# Patient Record
Sex: Female | Born: 1990
Health system: Southern US, Community
[De-identification: ages and names within clinical notes are randomized; demographics above are authoritative.]

## PROBLEM LIST (undated history)

## (undated) DIAGNOSIS — D573 Sickle-cell trait: Secondary | ICD-10-CM

## (undated) DIAGNOSIS — K589 Irritable bowel syndrome without diarrhea: Secondary | ICD-10-CM

## (undated) DIAGNOSIS — D649 Anemia, unspecified: Secondary | ICD-10-CM

## (undated) HISTORY — DX: Anemia, unspecified: D64.9

---
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
## 2010-04-06 ENCOUNTER — Encounter: Payer: Self-pay | Admitting: Sports Medicine

## 2012-12-09 ENCOUNTER — Other Ambulatory Visit: Payer: Self-pay | Admitting: Internal Medicine

## 2012-12-09 DIAGNOSIS — K3189 Other diseases of stomach and duodenum: Secondary | ICD-10-CM

## 2012-12-14 ENCOUNTER — Ambulatory Visit
Admission: RE | Admit: 2012-12-14 | Discharge: 2012-12-14 | Disposition: A | Discharge status: Home / Self Care | Payer: No Typology Code available for payment source | Source: Ambulatory Visit | Attending: Internal Medicine | Admitting: Internal Medicine

## 2012-12-14 DIAGNOSIS — K3189 Other diseases of stomach and duodenum: Secondary | ICD-10-CM

## 2013-05-25 ENCOUNTER — Ambulatory Visit
Admission: RE | Admit: 2013-05-25 | Discharge: 2013-05-25 | Disposition: A | Discharge status: Home / Self Care | Payer: No Typology Code available for payment source | Source: Ambulatory Visit | Attending: Infectious Disease | Admitting: Infectious Disease

## 2013-05-25 ENCOUNTER — Other Ambulatory Visit: Payer: Self-pay | Admitting: Infectious Disease

## 2013-05-25 DIAGNOSIS — Z201 Contact with and (suspected) exposure to tuberculosis: Secondary | ICD-10-CM

## 2013-11-06 ENCOUNTER — Encounter: Payer: Self-pay | Admitting: Nurse Practitioner

## 2015-01-30 IMAGING — US US ABDOMEN COMPLETE
1 series · 14 of 25 positions shown · non-contrast
Comparison: None.

CLINICAL DATA: Dyspepsia

EXAM:
ULTRASOUND ABDOMEN COMPLETE

[Series 1: us abdomen complete · 0.28mm/px · 14 of 75 slices shown]
[im 1/75]
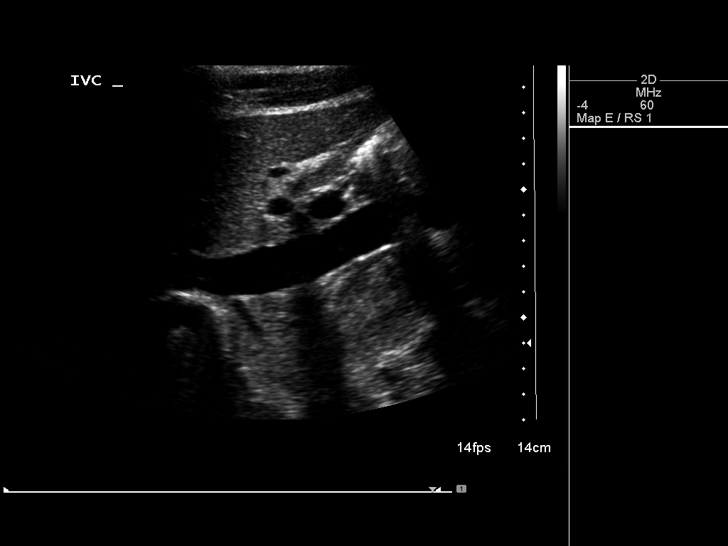
[im 7/75]
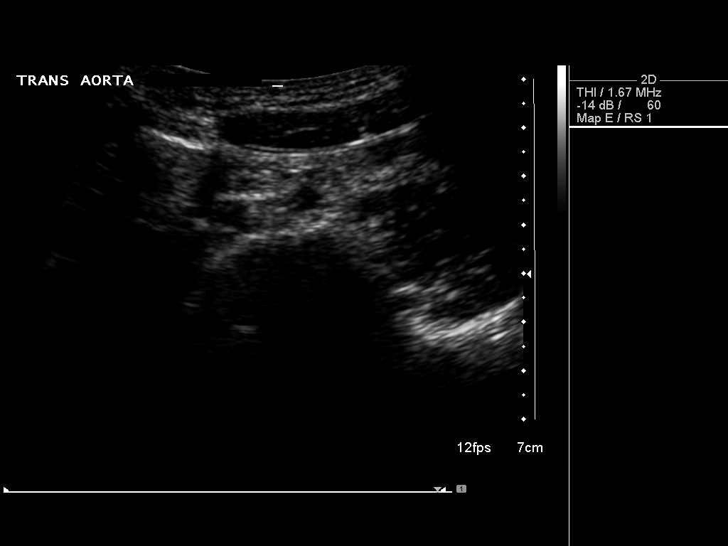
[im 13/75]
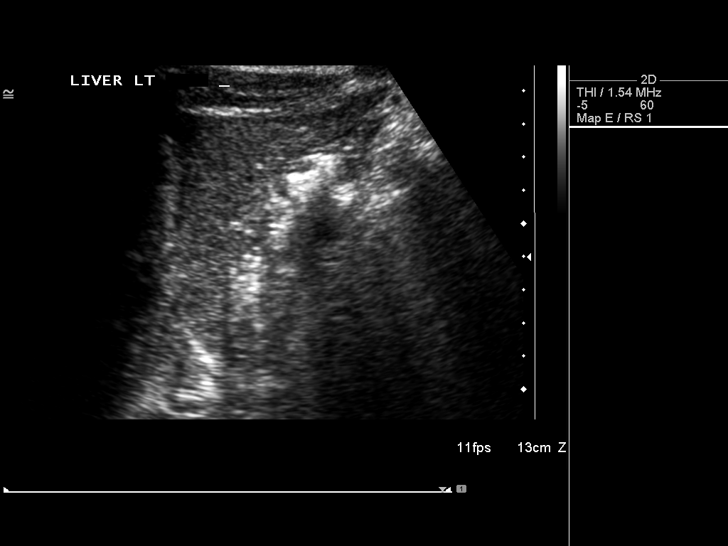
[im 19/75]
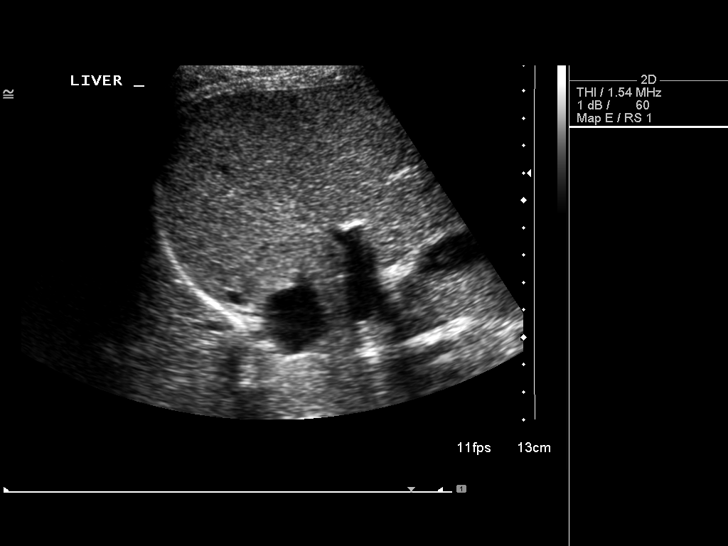
[im 25/75]
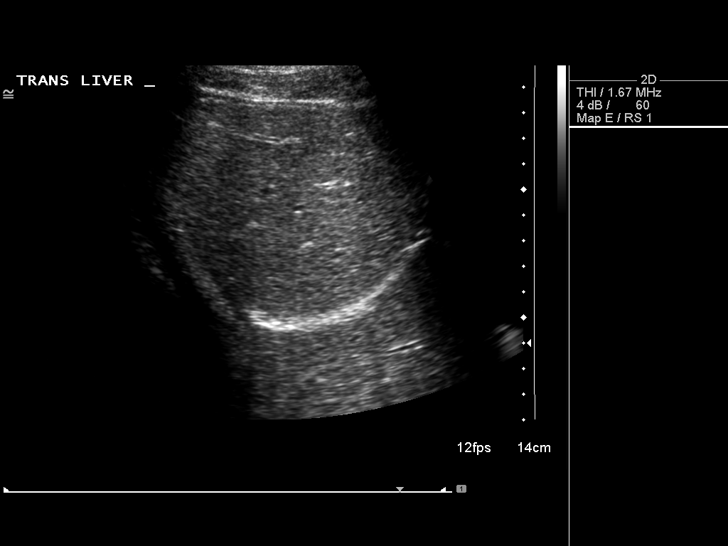
[im 28/75]
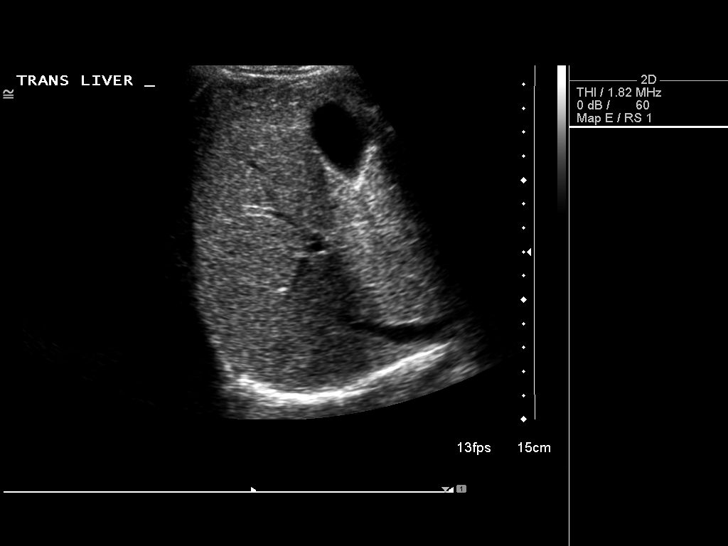
[im 34/75]
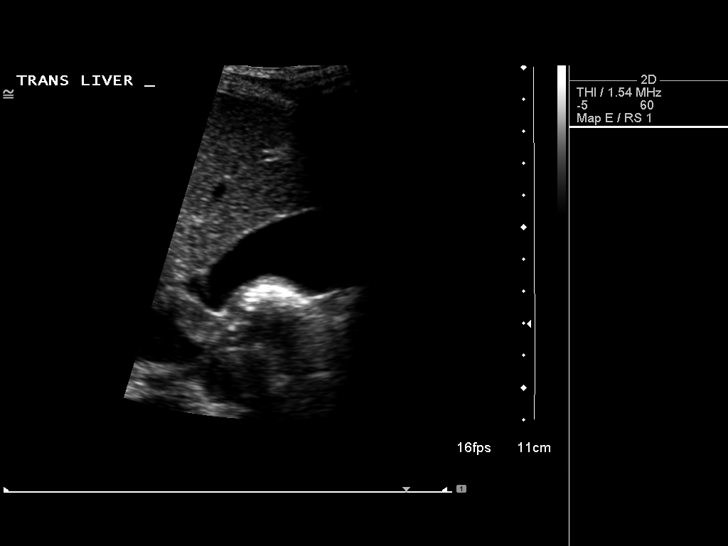
[im 41/75]
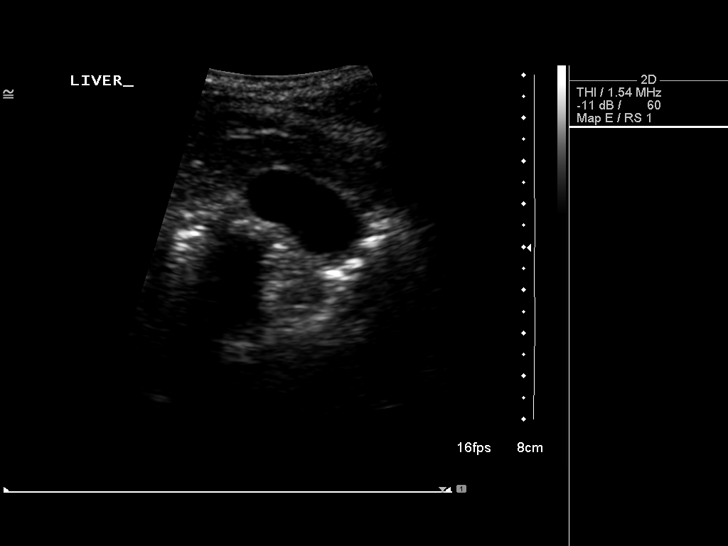
[im 47/75]
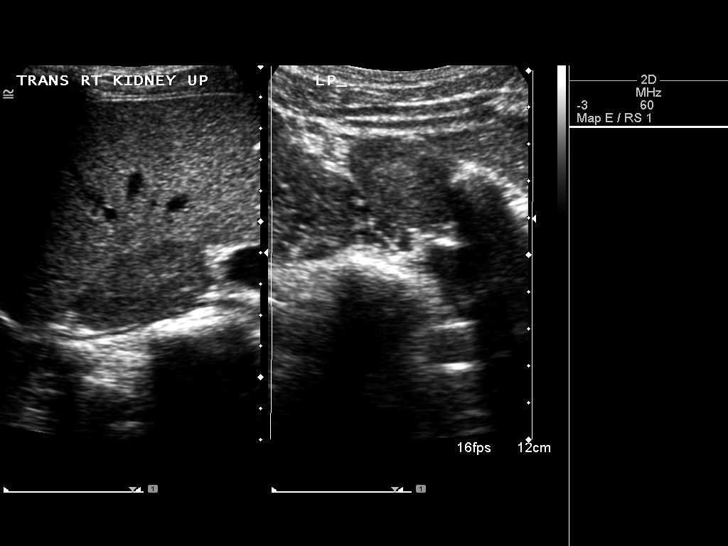
[im 50/75]
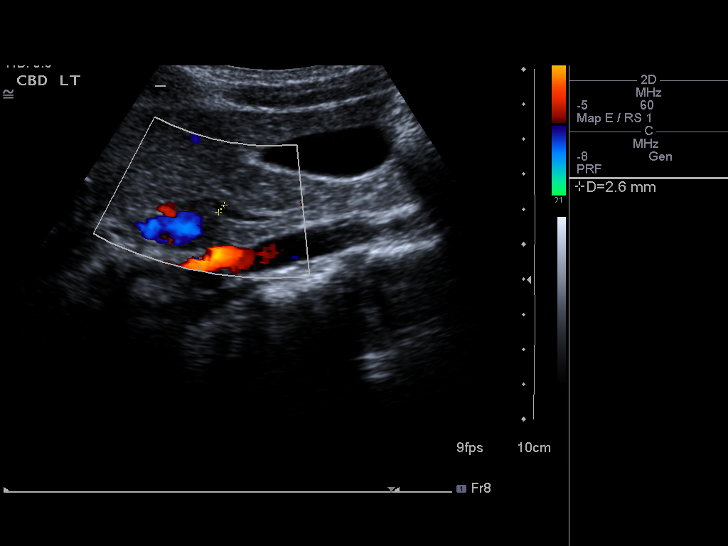
[im 56/75]
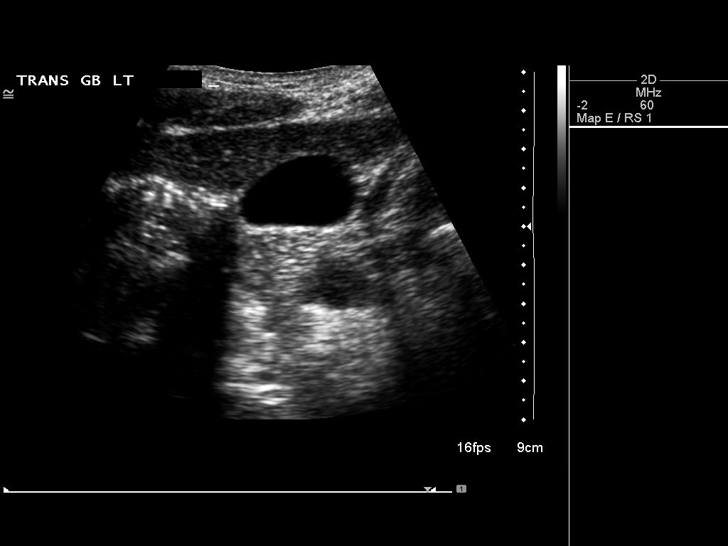
[im 62/75]
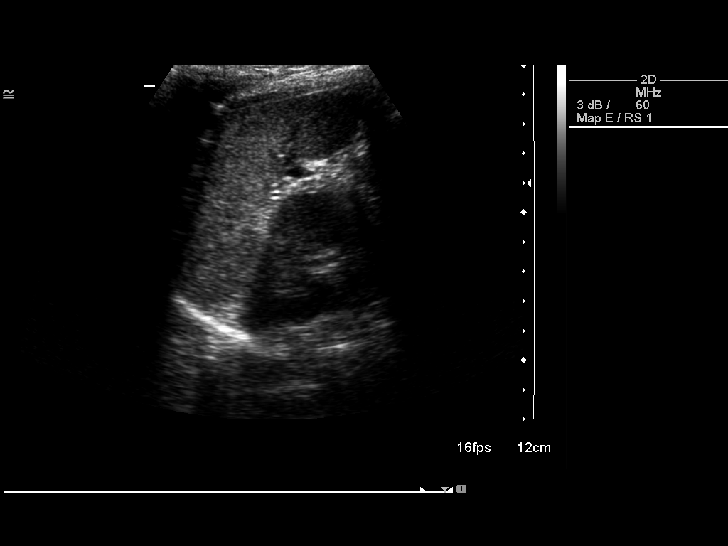
[im 68/75]
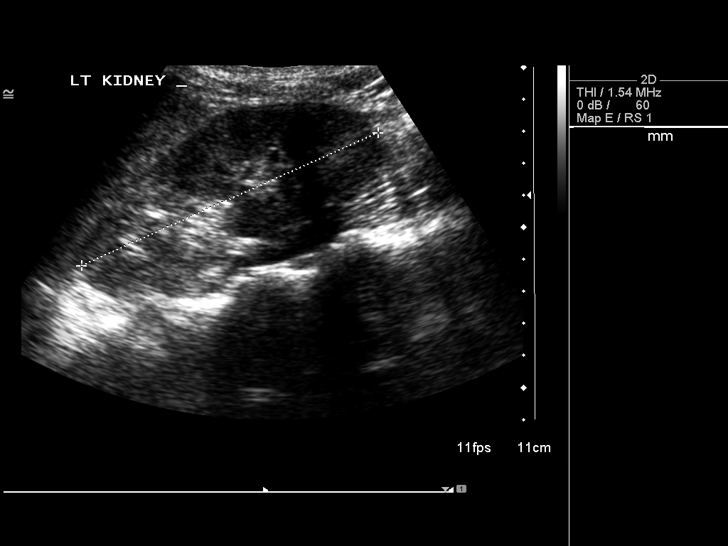
[im 75/75]
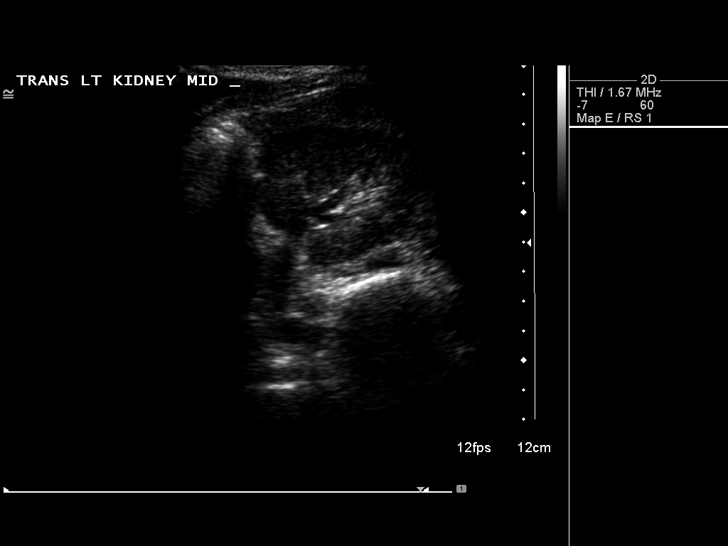

[14 of 25 positions shown; findings below may reference images not displayed]

FINDINGS: Gallbladder

No gallstones, gallbladder wall thickening, or pericholecystic
fluid. Negative sonographic Murphy's sign.

Common bile duct

Diameter: 3 mm.

Liver

No focal lesion identified. Within normal limits in parenchymal
echogenicity.

IVC

No abnormality visualized.

Pancreas

Incompletely visualized but grossly unremarkable.

Spleen

Measures 8.7 cm.

Right Kidney

Length: 10.6 cm. Echogenicity within normal limits. No mass or
hydronephrosis visualized.

Left Kidney

Length: 10.1 cm. Echogenicity within normal limits. No mass or
hydronephrosis visualized.

Abdominal aorta

No aneurysm visualized.
IMPRESSION: Negative abdominal ultrasound.

## 2015-07-11 IMAGING — CR DG CHEST 1V
1 series · 1 of 1 positions shown · non-contrast
Comparison: Chest x-ray of 12/30/2007

CLINICAL DATA: Exposure to TB

EXAM:
CHEST - 1 VIEW

[view not recorded]
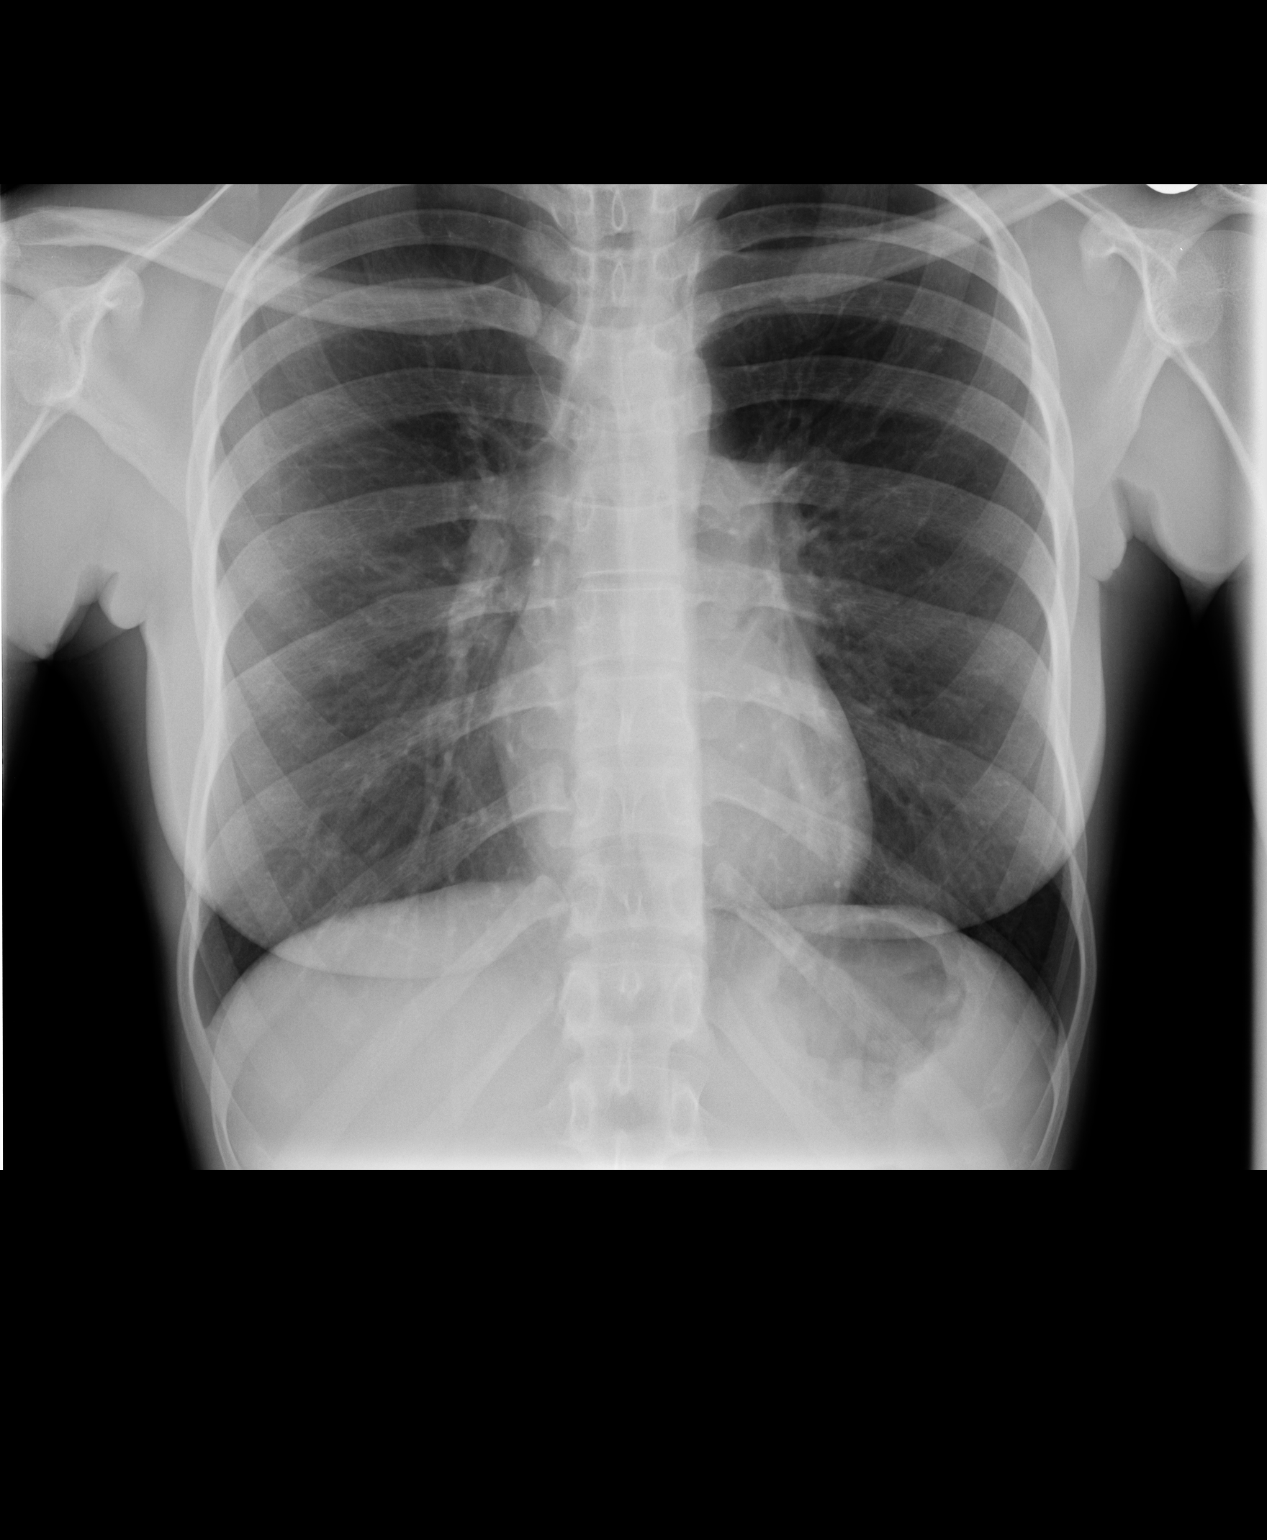

[1 of 1 positions shown; findings below may reference images not displayed]

FINDINGS: No active infiltrate or effusion is seen. No sequela of prior
tuberculous infection is seen. Mediastinal contours are stable and
heart size is normal. No bony abnormality is seen.
IMPRESSION: No active disease.

## 2016-09-20 DIAGNOSIS — M545 Low back pain: Secondary | ICD-10-CM | POA: Insufficient documentation

## 2016-09-21 ENCOUNTER — Encounter (HOSPITAL_COMMUNITY): Payer: Self-pay | Admitting: Emergency Medicine

## 2016-09-21 ENCOUNTER — Emergency Department (HOSPITAL_COMMUNITY)
Admission: EM | Admit: 2016-09-21 | Discharge: 2016-09-21 | Disposition: A | Discharge status: Home / Self Care | Payer: 59 | Attending: Emergency Medicine | Admitting: Emergency Medicine

## 2016-09-21 DIAGNOSIS — M545 Low back pain, unspecified: Secondary | ICD-10-CM

## 2016-09-21 HISTORY — DX: Sickle-cell trait: D57.3

## 2016-09-21 HISTORY — DX: Irritable bowel syndrome without diarrhea: K58.9

## 2016-09-21 MED ORDER — OXYCODONE-ACETAMINOPHEN 5-325 MG PO TABS
ORAL_TABLET | ORAL | Status: AC
  Filled 2016-09-21: qty 1

## 2016-09-21 MED ORDER — METHOCARBAMOL 500 MG PO TABS: 500.0000 mg | ORAL_TABLET | Freq: Two times a day (BID) | ORAL | 0 refills | Status: AC

## 2016-09-21 MED ORDER — IBUPROFEN 600 MG PO TABS: 600.0000 mg | ORAL_TABLET | Freq: Four times a day (QID) | ORAL | 0 refills | Status: AC | PRN

## 2016-09-21 MED ORDER — IBUPROFEN 400 MG PO TABS
600.0000 mg | ORAL_TABLET | Freq: Once | ORAL | Status: AC
  Administered 2016-09-21: 600 mg via ORAL
  Filled 2016-09-21: qty 1

## 2016-09-21 MED ORDER — OXYCODONE-ACETAMINOPHEN 5-325 MG PO TABS
1.0000 | ORAL_TABLET | Freq: Once | ORAL | Status: AC
  Administered 2016-09-21: 1 via ORAL

## 2016-09-21 MED FILL — METHOCARBAMOL 500 MG TABLET: 500 | 10 days supply | Qty: 20 | Fill #0

## 2016-09-21 MED FILL — IBUPROFEN 600 MG TABLET: 600 | 8 days supply | Qty: 30 | Fill #0

## 2016-09-21 NOTE — ED Provider Notes (Signed)
[facility]-EMERGENCY DEPT Provider Note   CSN: [id number] Arrival date & time: 09/20/16  2330     History   Chief Complaint Chief Complaint  Patient presents with  . Back Pain    HPI Nicole Hernandez is a 26 y.o. female.  Patient presents with low back pain that has been progressive x 1 week. She has tried ibuprofen, Norco, heat without relief. Pain has been constant but is worse with movement. No urinary or bowel incontinence. No numbness or weakness. The pain extends upward to upper back but no radiation to LE's. No chest or abdominal pain. History of back pain dating back to a teenage injury. No history of surgeries.       Past Medical History:  Diagnosis Date  . IBS (irritable bowel syndrome)   . Sickle cell trait (HCC)     There are no active problems to display for this patient.   History reviewed. No pertinent surgical history.  OB History    No data available       Home Medications    Prior to Admission medications   Not on File    Family History No family history on file.  Social History Social History  Substance Use Topics  . Smoking status: Never Smoker  . Smokeless tobacco: Never Used  . Alcohol use No     Allergies   Patient has no known allergies.   Review of Systems Review of Systems  Constitutional: Negative for chills and fever.  Respiratory: Negative.   Cardiovascular: Negative.   Gastrointestinal: Negative.   Genitourinary: Negative for enuresis.  Musculoskeletal:       See HPI.  Skin: Negative.   Neurological: Negative.      Physical Exam Updated Vital Signs BP 108/74 (BP Location: Right Arm)   Pulse 84   Temp 98.9 F (37.2 C) (Oral)   Resp 16   SpO2 100%   Physical Exam  Constitutional: She is oriented to person, place, and time. She appears well-developed and well-nourished.  Neck: Normal range of motion.  Pulmonary/Chest: Effort normal. No respiratory distress.  Musculoskeletal: Normal range of motion.    Reproducible tenderness to generalized back. No swelling. Pain increases with certain positions, however, she maintains FROM of back and extremities.   Neurological: She is alert and oriented to person, place, and time.  CN's 3-12 grossly intact. Speech is clear and focused. No facial asymmetry. No lateralizing weakness. No deficits of coordination. Ambulatory without imbalance.    Skin: Skin is warm and dry.     ED Treatments / Results  Labs (all labs ordered are listed, but only abnormal results are displayed) Labs Reviewed - No data to display  EKG  EKG Interpretation None       Radiology No results found.  Procedures Procedures (including critical care time)  Medications Ordered in ED Medications  oxyCODONE-acetaminophen (PERCOCET/ROXICET) 5-325 MG per tablet 1 tablet (1 tablet Oral Given 09/21/16 0013)     Initial Impression / Assessment and Plan / ED Course  I have reviewed the triage vital signs and the nursing notes.  Pertinent labs & imaging results that were available during my care of the patient were reviewed by me and considered in my medical decision making (see chart for details).     Patient with pain in generalized back, progressive over the last week. 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 No neurologic red flags.  Will treat as muscular strain/spasm. Refer to ortho is pain continues.   Final Clinical Impressions(s) / ED Diagnoses   Final diagnoses:  None   1. Muscular back pain  New Prescriptions New Prescriptions   No medications on file     Elpidio AnisUpstill, Quinlan Vollmer, Cordelia Poche-C 09/21/16 [id number]    Derwood KaplanNanavati, Ankit, MD 09/21/16 [phone]

## 2016-09-21 NOTE — ED Triage Notes (Signed)
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 , pain increases with movement .

## 2016-09-25 DIAGNOSIS — S335XXA Sprain of ligaments of lumbar spine, initial encounter: Secondary | ICD-10-CM | POA: Diagnosis not present

## 2016-09-29 DIAGNOSIS — S335XXA Sprain of ligaments of lumbar spine, initial encounter: Secondary | ICD-10-CM | POA: Diagnosis not present

## 2016-10-13 DIAGNOSIS — S335XXD Sprain of ligaments of lumbar spine, subsequent encounter: Secondary | ICD-10-CM | POA: Diagnosis not present

## 2016-10-26 DIAGNOSIS — M25512 Pain in left shoulder: Secondary | ICD-10-CM | POA: Diagnosis not present

## 2016-10-26 DIAGNOSIS — M545 Low back pain: Secondary | ICD-10-CM | POA: Diagnosis not present

## 2016-10-26 DIAGNOSIS — M6281 Muscle weakness (generalized): Secondary | ICD-10-CM | POA: Diagnosis not present

## 2016-12-05 ENCOUNTER — Ambulatory Visit: Payer: Self-pay | Admitting: Physician Assistant

## 2016-12-07 ENCOUNTER — Ambulatory Visit (INDEPENDENT_AMBULATORY_CARE_PROVIDER_SITE_OTHER): Payer: 59 | Admitting: Physician Assistant

## 2016-12-07 ENCOUNTER — Encounter: Payer: Self-pay | Admitting: Physician Assistant

## 2016-12-07 VITALS — BP 102/62 | HR 83 | Temp 98.3°F | Resp 16 | Ht 63.5 in | Wt 104.4 lb

## 2016-12-07 DIAGNOSIS — Z0000 Encounter for general adult medical examination without abnormal findings: Secondary | ICD-10-CM

## 2016-12-07 DIAGNOSIS — Z1329 Encounter for screening for other suspected endocrine disorder: Secondary | ICD-10-CM | POA: Diagnosis not present

## 2016-12-07 DIAGNOSIS — R5383 Other fatigue: Secondary | ICD-10-CM | POA: Diagnosis not present

## 2016-12-07 DIAGNOSIS — Z23 Encounter for immunization: Secondary | ICD-10-CM | POA: Diagnosis not present

## 2016-12-07 NOTE — Progress Notes (Signed)
[facility] [place], [place] [address] [phone]  Date:  12/07/2016   Name:  Nicole Hernandez   DOB:  1990-05-30   MRN:  [id number]  PCP:  Patient, No Pcp Per    Chief Complaint: Annual Exam (no pap) and sleep (still feel tired after sleeping, "restless")   History of Present Illness:  This is a 26 y.o. female with PMH anemia who is presenting for CPE. She is in the nursing program at [employer]  Complaints: fatigue x 1 year. She goes to bed around the same time every night. Does not wake up at night, no snoring.  LMP: May 2018 Contraception:  Nexplanon  Last pap: 2016  Sexual history: she has one female partner x 2.5 years Immunizations: UTD. She would like flu shot Dentist: regular check ups Eye: right 20/30, 20/40, b/l 20/30 Diet/Exercise: she does not exercise. Eats African diet: carbs, greens, rice. Eats a lot of fast food.  Fam hx: mother: pituitary tumor Tobacco/alcohol/substance use: never/no/no  Review of Systems:  Review of Systems  Constitutional: Positive for fatigue. Negative for chills, diaphoresis and fever.  HENT: Negative for congestion, postnasal drip, rhinorrhea, sinus pressure, sneezing and sore throat.   Respiratory: Negative for cough, chest tightness, shortness of breath and wheezing.   Cardiovascular: Negative for chest pain and palpitations.  Gastrointestinal: Negative for abdominal pain, diarrhea, nausea and vomiting.  Genitourinary: Negative for decreased urine volume, difficulty urinating, dysuria, enuresis, flank pain, frequency, hematuria and urgency.  Musculoskeletal: Negative for back pain.  Neurological: Negative for dizziness, weakness, light-headedness and headaches.  Psychiatric/Behavioral: Negative for sleep disturbance.    There are no active problems to display for this patient.   Prior to Admission medications   Medication Sig Start Date End Date Taking? Authorizing Provider  ibuprofen (ADVIL,MOTRIN) 600 MG tablet  Take 1 tablet (600 mg total) by mouth every 6 (six) hours as needed. 09/21/16  Yes Upstill, Nehemiah Settle, PA-C  methocarbamol (ROBAXIN) 500 MG tablet Take 1 tablet (500 mg total) by mouth 2 (two) times daily. Patient not taking: Reported on 12/07/2016 09/21/16   Charlann Lange, PA-C    No Known Allergies  No past surgical history on file.  Social History  Substance Use Topics  . Smoking status: Never Smoker  . Smokeless tobacco: Never Used  . Alcohol use No    No family history on file.  Medication list has been reviewed and updated.  Physical Examination:  Physical Exam  Constitutional: She is oriented to person, place, and time. She appears well-developed and well-nourished. No distress.  HENT:  Head: Normocephalic and atraumatic.  Mouth/Throat: Oropharynx is clear and moist.  Eyes: Pupils are equal, round, and reactive to light. Conjunctivae and EOM are normal.  Cardiovascular: Normal rate, regular rhythm and normal heart sounds.   No murmur heard. Pulmonary/Chest: Effort normal and breath sounds normal. She has no wheezes.  Musculoskeletal: Normal range of motion.  Neurological: She is alert and oriented to person, place, and time. She has normal reflexes.  Skin: Skin is warm and dry.  Psychiatric: She has a normal mood and affect. Her behavior is normal. Judgment and thought content normal.  Vitals reviewed.   BP 102/62   Pulse 83   Temp 98.3 F (36.8 C) (Oral)   Resp 16   Ht 5' 3.5" (1.613 m)   Wt 104 lb 6.4 oz (47.4 kg)   SpO2 97%   BMI 18.20 kg/m   Assessment and Plan: 1. Annual physical  exam - 26 year old female presents for annual exam c/o fatigue. Encouraged pt to start exercising, take multivitamin. RTC next year for PAP. Labs are pending, will contact with results.   2. Fatigue, unspecified type - CBC with Differential/Platelet - Vitamin B12 - Thyroid Panel With TSH  3. Screening for endocrine disorder - CMP14+EGFR  4. Need for prophylactic vaccination  and inoculation against influenza - Flu Vaccine QUAD 36+ mos IM   Mercer Pod, PA-C  [facility] [place] 12/07/2016 4:49 PM

## 2016-12-07 NOTE — Patient Instructions (Addendum)
Thank you for coming in today. I hope you feel we met your needs.  Feel free to call PCP if you have any questions or further requests.  Please consider signing up for MyChart if you do not already have it, as this is a great way to communicate with me.  Best,  [employer], PA-C  Start taking a multivitamin.  Start exercising.  We will contact you with the results of your lab work.   What are the benefits of exercise?-Exercise has many benefits. It can: ?Burn calories, which helps people control their weight ?Help control blood sugar levels in people with diabetes ?Lower blood pressure, especially in people with high blood pressure ?Lower stress and help with depression ?Keep bones strong, so they don't get thin and break easily ?Lower the chance of dying from heart disease   What are the main types of exercise?-There are 3 main types of exercise. They are: ?Aerobic exercise - Aerobic exercise raises a person's heart rate. Examples of aerobic exercise are walking, running, or swimming. ?Resistance training - Resistance training helps make your muscles stronger. People can do this type of exercise using weights, exercise bands, or weight machines. ?Stretching - Stretching exercises help your muscles and joints move more easily. It's important to have all 3 types of exercise in your exercise program. That way, your body, muscles, and joints can be as healthy as possible.  For substantial health benefits, adults are recommended to perform moderate-intensity aerobic exercise or vigorous aerobic exercise as follows: ?Moderate-intensity aerobic exercise for 150 minutes every week AND muscle-strengthening activities involving all major muscle groups at least two days per week, OR ?Vigorous-intensity aerobic exercise for 75 minutes every week AND muscle-strengthening activities involving all major muscle groups at least two days per week, OR ?An equivalent mix of moderate- and  vigorous-intensity aerobic exercise AND muscle-strengthening activities involving all major muscle groups at least two days per week  What should I do when I exercise?-Each time you exercise, you should: ?Warm up - Warming up can help keep you from hurting your muscles when you exercise. To warm up, do a light aerobic exercise (such as walking slowly) or stretch for 5 to 10 minutes. ?Work out - During a workout, you can walk fast, swim, run, or use an exercise machine, for example. You should also stretch all of your joints, including your neck, shoulders, back, hips, and knees. At least 2 times a week, you can add resistance training exercises to your workout. ?Cool down - Cooling down helps keep you from feeling dizzy after you exercise and helps prevent muscle cramps. To cool down, you can stretch or do a light aerobic exercise for 5 minutes.  How often should I exercise?-Doctors recommend that people exercise at least 30 minutes a day, on 5 or more days of the week. If you can't exercise for 30 minutes straight, try to exercise for 10 minutes at a time, 3 or 4 times a day.   Health Maintenance, Female Adopting a healthy lifestyle and getting preventive care can go a long way to promote health and wellness. Talk with your health care provider about what schedule of regular examinations is right for you. This is a good chance for you to check in with your provider about disease prevention and staying healthy. In between checkups, there are plenty of things you can do on your own. Experts have done a lot of research about which lifestyle changes and preventive measures are most likely to keep  you healthy. Ask your health care provider for more information. Weight and diet Eat a healthy diet  Be sure to include plenty of vegetables, fruits, low-fat dairy products, and lean protein.  Do not eat a lot of foods high in solid fats, added sugars, or salt.  Get regular exercise. This is one of the  most important things you can do for your health. ? Most adults should exercise for at least 150 minutes each week. The exercise should increase your heart rate and make you sweat (moderate-intensity exercise). ? Most adults should also do strengthening exercises at least twice a week. This is in addition to the moderate-intensity exercise.  Maintain a healthy weight  Body mass index (BMI) is a measurement that can be used to identify possible weight problems. It estimates body fat based on height and weight. Your health care provider can help determine your BMI and help you achieve or maintain a healthy weight.  For females 81 years of age and older: ? A BMI below 18.5 is considered underweight. ? A BMI of 18.5 to 24.9 is normal. ? A BMI of 25 to 29.9 is considered overweight. ? A BMI of 30 and above is considered obese.  Watch levels of cholesterol and blood lipids  You should start having your blood tested for lipids and cholesterol at 26 years of age, then have this test every 5 years.  You may need to have your cholesterol levels checked more often if: ? Your lipid or cholesterol levels are high. ? You are older than 26 years of age. ? You are at high risk for heart disease.  Cancer screening Lung Cancer  Lung cancer screening is recommended for adults 7-73 years old who are at high risk for lung cancer because of a history of smoking.  A yearly low-dose CT scan of the lungs is recommended for people who: ? Currently smoke. ? Have quit within the past 15 years. ? Have at least a 30-pack-year history of smoking. A pack year is smoking an average of one pack of cigarettes a day for 1 year.  Yearly screening should continue until it has been 15 years since you quit.  Yearly screening should stop if you develop a health problem that would prevent you from having lung cancer treatment.  Breast Cancer  Practice breast self-awareness. This means understanding how your breasts  normally appear and feel.  It also means doing regular breast self-exams. Let your health care provider know about any changes, no matter how small.  If you are in your 20s or 30s, you should have a clinical breast exam (CBE) by a health care provider every 1-3 years as part of a regular health exam.  If you are 11 or older, have a CBE every year. Also consider having a breast X-ray (mammogram) every year.  If you have a family history of breast cancer, talk to your health care provider about genetic screening.  If you are at high risk for breast cancer, talk to your health care provider about having an MRI and a mammogram every year.  Breast cancer gene (BRCA) assessment is recommended for women who have family members with BRCA-related cancers. BRCA-related cancers include: ? Breast. ? Ovarian. ? Tubal. ? Peritoneal cancers.  Results of the assessment will determine the need for genetic counseling and BRCA1 and BRCA2 testing.  Cervical Cancer Your health care provider may recommend that you be screened regularly for cancer of the pelvic organs (ovaries, uterus, and vagina).  This screening involves a pelvic examination, including checking for microscopic changes to the surface of your cervix (Pap test). You may be encouraged to have this screening done every 3 years, beginning at age 62.  For women ages 71-65, health care providers may recommend pelvic exams and Pap testing every 3 years, or they may recommend the Pap and pelvic exam, combined with testing for human papilloma virus (HPV), every 5 years. Some types of HPV increase your risk of cervical cancer. Testing for HPV may also be done on women of any age with unclear Pap test results.  Other health care providers may not recommend any screening for nonpregnant women who are considered low risk for pelvic cancer and who do not have symptoms. Ask your health care provider if a screening pelvic exam is right for you.  If you have had  past treatment for cervical cancer or a condition that could lead to cancer, you need Pap tests and screening for cancer for at least 20 years after your treatment. If Pap tests have been discontinued, your risk factors (such as having a new sexual partner) need to be reassessed to determine if screening should resume. Some women have medical problems that increase the chance of getting cervical cancer. In these cases, your health care provider may recommend more frequent screening and Pap tests.  Colorectal Cancer  This type of cancer can be detected and often prevented.  Routine colorectal cancer screening usually begins at 26 years of age and continues through 26 years of age.  Your health care provider may recommend screening at an earlier age if you have risk factors for colon cancer.  Your health care provider may also recommend using home test kits to check for hidden blood in the stool.  A small camera at the end of a tube can be used to examine your colon directly (sigmoidoscopy or colonoscopy). This is done to check for the earliest forms of colorectal cancer.  Routine screening usually begins at age 24.  Direct examination of the colon should be repeated every 5-10 years through 26 years of age. However, you may need to be screened more often if early forms of precancerous polyps or small growths are found.  Skin Cancer  Check your skin from head to toe regularly.  Tell your health care provider about any new moles or changes in moles, especially if there is a change in a mole's shape or color.  Also tell your health care provider if you have a mole that is larger than the size of a pencil eraser.  Always use sunscreen. Apply sunscreen liberally and repeatedly throughout the day.  Protect yourself by wearing long sleeves, pants, a wide-brimmed hat, and sunglasses whenever you are outside.  Heart disease, diabetes, and high blood pressure  High blood pressure causes heart  disease and increases the risk of stroke. High blood pressure is more likely to develop in: ? People who have blood pressure in the high end of the normal range (130-139/85-89 mm Hg). ? People who are overweight or obese. ? People who are African American.  If you are 20-53 years of age, have your blood pressure checked every 3-5 years. If you are 58 years of age or older, have your blood pressure checked every year. You should have your blood pressure measured twice-once when you are at a hospital or clinic, and once when you are not at a hospital or clinic. Record the average of the two measurements. To check your  blood pressure when you are not at a hospital or clinic, you can use: ? An automated blood pressure machine at a pharmacy. ? A home blood pressure monitor.  If you are between 51 years and 47 years old, ask your health care provider if you should take aspirin to prevent strokes.  Have regular diabetes screenings. This involves taking a blood sample to check your fasting blood sugar level. ? If you are at a normal weight and have a low risk for diabetes, have this test once every three years after 26 years of age. ? If you are overweight and have a high risk for diabetes, consider being tested at a younger age or more often. Preventing infection Hepatitis B  If you have a higher risk for hepatitis B, you should be screened for this virus. You are considered at high risk for hepatitis B if: ? You were born in a country where hepatitis B is common. Ask your health care provider which countries are considered high risk. ? Your parents were born in a high-risk country, and you have not been immunized against hepatitis B (hepatitis B vaccine). ? You have HIV or AIDS. ? You use needles to inject street drugs. ? You live with someone who has hepatitis B. ? You have had sex with someone who has hepatitis B. ? You get hemodialysis treatment. ? You take certain medicines for conditions,  including cancer, organ transplantation, and autoimmune conditions.  Hepatitis C  Blood testing is recommended for: ? Everyone born from [birth year] through 1965. ? Anyone with known risk factors for hepatitis C.  Sexually transmitted infections (STIs)  You should be screened for sexually transmitted infections (STIs) including gonorrhea and chlamydia if: ? You are sexually active and are younger than 26 years of age. ? You are older than 26 years of age and your health care provider tells you that you are at risk for this type of infection. ? Your sexual activity has changed since you were last screened and you are at an increased risk for chlamydia or gonorrhea. Ask your health care provider if you are at risk.  If you do not have HIV, but are at risk, it may be recommended that you take a prescription medicine daily to prevent HIV infection. This is called pre-exposure prophylaxis (PrEP). You are considered at risk if: ? You are sexually active and do not regularly use condoms or know the HIV status of your partner(s). ? You take drugs by injection. ? You are sexually active with a partner who has HIV.  Talk with your health care provider about whether you are at high risk of being infected with HIV. If you choose to begin PrEP, you should first be tested for HIV. You should then be tested every 3 months for as long as you are taking PrEP. Pregnancy  If you are premenopausal and you may become pregnant, ask your health care provider about preconception counseling.  If you may become pregnant, take 400 to 800 micrograms (mcg) of folic acid every day.  If you want to prevent pregnancy, talk to your health care provider about birth control (contraception). Osteoporosis and menopause  Osteoporosis is a disease in which the bones lose minerals and strength with aging. This can result in serious bone fractures. Your risk for osteoporosis can be identified using a bone density scan.  If you are  77 years of age or older, or if you are at risk for osteoporosis and fractures, ask your  health care provider if you should be screened.  Ask your health care provider whether you should take a calcium or vitamin D supplement to lower your risk for osteoporosis.  Menopause may have certain physical symptoms and risks.  Hormone replacement therapy may reduce some of these symptoms and risks. Talk to your health care provider about whether hormone replacement therapy is right for you. Follow these instructions at home:  Schedule regular health, dental, and eye exams.  Stay current with your immunizations.  Do not use any tobacco products including cigarettes, chewing tobacco, or electronic cigarettes.  If you are pregnant, do not drink alcohol.  If you are breastfeeding, limit how much and how often you drink alcohol.  Limit alcohol intake to no more than 1 drink per day for nonpregnant women. One drink equals 12 ounces of beer, 5 ounces of wine, or 1 ounces of hard liquor.  Do not use street drugs.  Do not share needles.  Ask your health care provider for help if you need support or information about quitting drugs.  Tell your health care provider if you often feel depressed.  Tell your health care provider if you have ever been abused or do not feel safe at home. This information is not intended to replace advice given to you by your health care provider. Make sure you discuss any questions you have with your health care provider. Document Released: 09/15/2010 Document Revised: 08/08/2015 Document Reviewed: 12/04/2014 Elsevier Interactive Patient Education  2018 [employer].   IF you received an x-ray today, you will receive an invoice from [facility]. Please contact [facility] at [phone] with questions or concerns regarding your invoice.   IF you received labwork today, you will receive an invoice from [place]. Please contact [facility] at  [phone] with questions or concerns regarding your invoice.   Our billing staff will not be able to assist you with questions regarding bills from these companies.  You will be contacted with the lab results as soon as they are available. The fastest way to get your results is to activate your My Chart account. Instructions are located on the last page of this paperwork. If you have not heard from [place] regarding the results in 2 weeks, please contact this office.

## 2016-12-08 LAB — CMP14+EGFR
ALT: 17 IU/L (ref 0–32)
AST: 18 IU/L (ref 0–40)
Albumin/Globulin Ratio: 1.4 (ref 1.2–2.2)
Albumin: 4.3 g/dL (ref 3.5–5.5)
Alkaline Phosphatase: 56 IU/L (ref 39–117)
BUN/Creatinine Ratio: 10 (ref 9–23)
BUN: 8 mg/dL (ref 6–20)
Bilirubin Total: 0.3 mg/dL (ref 0.0–1.2)
CO2: 24 mmol/L (ref 20–29)
Calcium: 9.3 mg/dL (ref 8.7–10.2)
Chloride: 105 mmol/L (ref 96–106)
Creatinine, Ser: 0.84 mg/dL (ref 0.57–1.00)
GFR calc Af Amer: 112 mL/min/{1.73_m2} (ref >59)
GFR calc non Af Amer: 97 mL/min/{1.73_m2} (ref >59)
Globulin, Total: 3 g/dL (ref 1.5–4.5)
Glucose: 83 mg/dL (ref 65–99)
Potassium: 4.5 mmol/L (ref 3.5–5.2)
Sodium: 139 mmol/L (ref 134–144)
Total Protein: 7.3 g/dL (ref 6.0–8.5)

## 2016-12-08 LAB — CBC WITH DIFFERENTIAL/PLATELET
Basophils Absolute: 0 10*3/uL (ref 0.0–0.2)
Basos: 1 %
EOS (ABSOLUTE): 0.1 10*3/uL (ref 0.0–0.4)
Eos: 1 %
Hematocrit: 38.8 % (ref 34.0–46.6)
Hemoglobin: 13.3 g/dL (ref 11.1–15.9)
Immature Grans (Abs): 0 10*3/uL (ref 0.0–0.1)
Immature Granulocytes: 0 %
Lymphocytes Absolute: 1.4 10*3/uL (ref 0.7–3.1)
Lymphs: 35 %
MCH: 32.1 pg (ref 26.6–33.0)
MCHC: 34.3 g/dL (ref 31.5–35.7)
MCV: 94 fL (ref 79–97)
Monocytes Absolute: 0.3 10*3/uL (ref 0.1–0.9)
Monocytes: 7 %
Neutrophils Absolute: 2.3 10*3/uL (ref 1.4–7.0)
Neutrophils: 56 %
Platelets: 270 10*3/uL (ref 150–379)
RBC: 4.14 x10E6/uL (ref 3.77–5.28)
RDW: 12.5 % (ref 12.3–15.4)
WBC: 4.1 10*3/uL (ref 3.4–10.8)

## 2016-12-08 LAB — VITAMIN B12: Vitamin B-12: 436 pg/mL (ref 232–1245)

## 2016-12-08 LAB — THYROID PANEL WITH TSH
Free Thyroxine Index: 1.7 (ref 1.2–4.9)
T3 Uptake Ratio: 25 % (ref 24–39)
T4, Total: 6.7 ug/dL (ref 4.5–12.0)
TSH: 0.724 u[IU]/mL (ref 0.450–4.500)

## 2016-12-10 ENCOUNTER — Encounter: Payer: Self-pay | Admitting: Physician Assistant

## 2016-12-10 DIAGNOSIS — H52223 Regular astigmatism, bilateral: Secondary | ICD-10-CM | POA: Diagnosis not present

## 2016-12-10 DIAGNOSIS — H5213 Myopia, bilateral: Secondary | ICD-10-CM | POA: Diagnosis not present

## 2016-12-11 NOTE — Progress Notes (Signed)
Letter sent.

## 2017-08-10 ENCOUNTER — Encounter: Payer: Self-pay | Admitting: *Deleted

## 2018-12-30 DIAGNOSIS — R05 Cough: Secondary | ICD-10-CM | POA: Diagnosis not present

## 2018-12-31 DIAGNOSIS — R05 Cough: Secondary | ICD-10-CM | POA: Diagnosis not present

## 2019-05-12 MED FILL — AZITHROMYCIN 500 MG TABLET: 500 | 3 days supply | Qty: 4 | Fill #0

## 2019-05-12 MED FILL — MEFLOQUINE HCL 250 MG TAB: 250 | 42 days supply | Qty: 9 | Fill #0

## 2019-05-30 DIAGNOSIS — Z20828 Contact with and (suspected) exposure to other viral communicable diseases: Secondary | ICD-10-CM | POA: Diagnosis not present

## 2019-05-30 DIAGNOSIS — Z03818 Encounter for observation for suspected exposure to other biological agents ruled out: Secondary | ICD-10-CM | POA: Diagnosis not present

## 2019-06-30 DIAGNOSIS — B373 Candidiasis of vulva and vagina: Secondary | ICD-10-CM | POA: Diagnosis not present

## 2019-06-30 DIAGNOSIS — Z3046 Encounter for surveillance of implantable subdermal contraceptive: Secondary | ICD-10-CM | POA: Diagnosis not present

## 2019-08-08 DIAGNOSIS — Z111 Encounter for screening for respiratory tuberculosis: Secondary | ICD-10-CM | POA: Diagnosis not present

## 2019-08-30 DIAGNOSIS — Z135 Encounter for screening for eye and ear disorders: Secondary | ICD-10-CM | POA: Diagnosis not present

## 2019-08-30 DIAGNOSIS — H5213 Myopia, bilateral: Secondary | ICD-10-CM | POA: Diagnosis not present

## 2019-09-06 ENCOUNTER — Encounter: Payer: No Typology Code available for payment source | Admitting: Medical

## 2019-09-06 ENCOUNTER — Telehealth: Payer: Self-pay | Admitting: Radiology

## 2019-09-06 NOTE — Telephone Encounter (Signed)
Left voicemail to call [email or web address] to reschedule appointment with Corliss Parish from 09/06/19.
